# Patient Record
Sex: Female | Born: 1980 | Race: Black or African American | Hispanic: No | Marital: Married | Smoking: Never smoker
Health system: Southern US, Community
[De-identification: ages and names within clinical notes are randomized; demographics above are authoritative.]

## PROBLEM LIST (undated history)

## (undated) ENCOUNTER — Inpatient Hospital Stay (HOSPITAL_COMMUNITY): Payer: Self-pay

## (undated) DIAGNOSIS — R12 Heartburn: Secondary | ICD-10-CM

## (undated) DIAGNOSIS — G5600 Carpal tunnel syndrome, unspecified upper limb: Secondary | ICD-10-CM

## (undated) DIAGNOSIS — O409XX0 Polyhydramnios, unspecified trimester, not applicable or unspecified: Secondary | ICD-10-CM

## (undated) DIAGNOSIS — R51 Headache: Secondary | ICD-10-CM

## (undated) DIAGNOSIS — O24419 Gestational diabetes mellitus in pregnancy, unspecified control: Secondary | ICD-10-CM

## (undated) DIAGNOSIS — O26899 Other specified pregnancy related conditions, unspecified trimester: Secondary | ICD-10-CM

## (undated) DIAGNOSIS — J45909 Unspecified asthma, uncomplicated: Secondary | ICD-10-CM

## (undated) HISTORY — PX: NECK SURGERY: SHX720

---

## 2010-08-23 HISTORY — PX: MYOMECTOMY: SHX85

## 2013-05-02 ENCOUNTER — Encounter (HOSPITAL_COMMUNITY): Payer: Self-pay | Admitting: *Deleted

## 2013-05-02 ENCOUNTER — Inpatient Hospital Stay (HOSPITAL_COMMUNITY)
Admission: AD | Admit: 2013-05-02 | Discharge: 2013-05-02 | Disposition: A | Discharge status: Home / Self Care | Payer: BC Managed Care – PPO | Source: Ambulatory Visit | Attending: Obstetrics and Gynecology | Admitting: Obstetrics and Gynecology

## 2013-05-02 ENCOUNTER — Inpatient Hospital Stay (HOSPITAL_COMMUNITY): Payer: BC Managed Care – PPO

## 2013-05-02 DIAGNOSIS — R32 Unspecified urinary incontinence: Secondary | ICD-10-CM | POA: Insufficient documentation

## 2013-05-02 DIAGNOSIS — O99891 Other specified diseases and conditions complicating pregnancy: Secondary | ICD-10-CM | POA: Insufficient documentation

## 2013-05-02 DIAGNOSIS — D259 Leiomyoma of uterus, unspecified: Secondary | ICD-10-CM | POA: Insufficient documentation

## 2013-05-02 DIAGNOSIS — O3410 Maternal care for benign tumor of corpus uteri, unspecified trimester: Secondary | ICD-10-CM | POA: Insufficient documentation

## 2013-05-02 LAB — WET PREP, GENITAL
Trich, Wet Prep: NONE SEEN
Yeast Wet Prep HPF POC: NONE SEEN

## 2013-05-02 LAB — AMNISURE RUPTURE OF MEMBRANE (ROM) NOT AT ARMC: Amnisure ROM: NEGATIVE

## 2013-05-02 NOTE — Progress Notes (Signed)
Obstetric ultrasound performed today.   Study limited today to evaluation of amniotic fluid volume.  [place] IUP at 21w 1d Normal amniotic fluid volume Uterine fibroids  If not previously done, recommend ultrasound evaluation of fetal anatomy.  Given prior history of preterm delivery, serial transvaginal cervical length evaluations may also be of benefit until approximately 28 weeks.  Review prior operative reports from myomectomy and cesaran section.   Please see full report in [facility].

## 2013-05-02 NOTE — MAU Provider Note (Signed)
MAU NOTE -  Subjective: G2P0101 @[redacted]w[redacted]d  by 14 wk sono. Woke up at 0500 this am, felt wet, stood up and had fluid running down thigh. No VB or ctx, +FM. H/o myomectomy and CS for PPROM and FTP @ 34-35 wks. Weekly 17P injections.      Objective: Vital signs: VS: Blood pressure 123/59, pulse 92, temperature 97.2 F (36.2 C), temperature source Oral, resp. rate 20, height 4\' 11"  (1.499 m), weight 117.935 kg (260 lb).  Labs: Wet prep: small WBCs, neg yeast, neg clue Amnisure: negative  Physical exam:        General appearance/behavior: obese BF, anxious       Heart: RRR       Lungs CTA bilaterally       Abdomen: soft, non-tender, gravid       Extremities: trace edema to feet       External genitalia: nml       Speculum examination: No pooling, small white discharge, negative fern       Bimanual exam / VE: deferred        Fetal Assessment:  FHR 135 on sono  SONO: AFI 13.6, posterior placenta above os, cervical length 4.2cm, FHR 135   Assessment: 21.[redacted] weeks gestation Urinary incontinence  Plan:  Discharge home Follow-up 05/05/13 with Dr. Cherly Hensen, GTT and 17P injection Preterm labor precautions  Dr Ernestina Penna updated with patient status / plan of care  Donette Larry, N MSN, CNM 05/02/2013, 7:08 AM

## 2013-05-02 NOTE — MAU Note (Signed)
Pt reports gush of fluid at 0505.

## 2013-07-13 ENCOUNTER — Other Ambulatory Visit: Payer: Self-pay | Admitting: Obstetrics and Gynecology

## 2013-07-28 ENCOUNTER — Other Ambulatory Visit (HOSPITAL_COMMUNITY): Payer: Self-pay | Admitting: Obstetrics and Gynecology

## 2013-07-28 DIAGNOSIS — O24419 Gestational diabetes mellitus in pregnancy, unspecified control: Secondary | ICD-10-CM

## 2013-07-28 DIAGNOSIS — O409XX1 Polyhydramnios, unspecified trimester, fetus 1: Secondary | ICD-10-CM

## 2013-08-02 ENCOUNTER — Other Ambulatory Visit (HOSPITAL_COMMUNITY): Payer: Self-pay | Admitting: Obstetrics and Gynecology

## 2013-08-02 ENCOUNTER — Ambulatory Visit (HOSPITAL_COMMUNITY)
Admission: RE | Admit: 2013-08-02 | Discharge: 2013-08-02 | Disposition: A | Discharge status: Home / Self Care | Payer: BC Managed Care – PPO | Source: Ambulatory Visit | Attending: Obstetrics and Gynecology | Admitting: Obstetrics and Gynecology

## 2013-08-02 DIAGNOSIS — O24419 Gestational diabetes mellitus in pregnancy, unspecified control: Secondary | ICD-10-CM

## 2013-08-02 DIAGNOSIS — E669 Obesity, unspecified: Secondary | ICD-10-CM | POA: Insufficient documentation

## 2013-08-02 DIAGNOSIS — O409XX1 Polyhydramnios, unspecified trimester, fetus 1: Secondary | ICD-10-CM

## 2013-08-02 DIAGNOSIS — O99810 Abnormal glucose complicating pregnancy: Secondary | ICD-10-CM | POA: Insufficient documentation

## 2013-08-02 NOTE — Consult Note (Signed)
Maternal Fetal Medicine Consultation  Requesting Provider(s): Maxie Better, MD  Reason for consultation: Polyhydramnios, poorly controlled A2 GDM  HPI: Desiree Hopkins is a 32 yo G2P0101 currently at 66 2/7 weeks - seen today due to polyhydramnios and poorly controlled gestational diabetes.  She is currently on Glyburide 7.5 mg BID.  Reviewed fingerstick log - fasting sugars consistently in the 110s- 120's and most post prandial values also elevated in the 130-140 range despite medications. The patient's past OB history is remarkable for gestational diabetes in her previous pregnancy - she reports that she required insulin for that pregnancy.  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  The patient subsequently underwent an open myomectomy in 2011 and was told by the operating physician that she would require cesarean delivery in the future.  She is otherwise without complaints.  The fetus is active.  OB History: OB History   Grav Para Term Preterm Abortions TAB SAB Ect Mult Living   2 1  1      1       PMH:  Past Medical History  Diagnosis Date  . Medical history non-contributory     PSH:  Past Surgical History  Procedure Laterality Date  . Cesarean section    . Myomectomy     Meds: Glyburide 7.5 mg BID, Prenatal vitamins  Allergies: NKDA  FH: Denies family history of birth defects or hereditary disorders.  DM, HTN in maternal and paternal grandmothers. Paternal grandmother with breast CA  Soc: denies tobacco, ETOH use  Review of Systems: no vaginal bleeding or cramping/contractions, no LOF, no nausea/vomiting. All other systems reviewed and are negative.  PE:  Wt: 276 #, 104/65, 98  GEN: well-appearing female ABD: gravid, NT  Ultrasound: Single IUP at 34 2/7 weeks Poorly controlled gestational diabetes Limited views of the fetal anatomy obtained due to late gestational age No gross anomalies  noted Estimated fetal weight > 90th %tile (3007 g). [facility] is > 97th %tile. Polyhydramnios noted with an AFI of 36 cm - most likely due to poorly controlled gestational diabetes.   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  Prescriptions given.  Patient will follow up in MFM clinic on Thursday to meet with diabetic educator to go over insulin administration and dose.  Will continue to check fasting and 2 hr post prandial fingerstick values.  She will need weekly follow up to adjust insulin dose as appropriate to achieve target ranges. 2) Recommend 2x weekly NSTs with weekly AFIs. 3) With a prior history of an open myomectomy, recommend repeat C-section.  Ideally, would schedule the case at approximately 37 weeks due to risk of uterine rupture.  Thank you for the opportunity to be a part of the care of Desiree Hopkins. Please contact our office if we can be of further assistance.   I spent approximately 30 minutes with this patient  with over 50% of time spent in face-to-face counseling.  Alpha Gula, MD Maternal Fetal Medicine

## 2013-08-03 ENCOUNTER — Other Ambulatory Visit: Payer: Self-pay

## 2013-08-04 ENCOUNTER — Ambulatory Visit (HOSPITAL_COMMUNITY): Payer: BC Managed Care – PPO

## 2013-08-04 ENCOUNTER — Telehealth (HOSPITAL_COMMUNITY): Payer: Self-pay | Admitting: *Deleted

## 2013-08-05 ENCOUNTER — Other Ambulatory Visit (HOSPITAL_COMMUNITY): Payer: Self-pay | Admitting: Obstetrics and Gynecology

## 2013-08-05 ENCOUNTER — Ambulatory Visit (HOSPITAL_COMMUNITY)
Admission: RE | Admit: 2013-08-05 | Discharge: 2013-08-05 | Disposition: A | Discharge status: Home / Self Care | Payer: BC Managed Care – PPO | Source: Ambulatory Visit | Attending: Obstetrics and Gynecology | Admitting: Obstetrics and Gynecology

## 2013-08-05 ENCOUNTER — Encounter: Payer: BC Managed Care – PPO | Attending: Obstetrics and Gynecology | Admitting: *Deleted

## 2013-08-05 DIAGNOSIS — O409XX0 Polyhydramnios, unspecified trimester, not applicable or unspecified: Secondary | ICD-10-CM | POA: Insufficient documentation

## 2013-08-05 DIAGNOSIS — Z713 Dietary counseling and surveillance: Secondary | ICD-10-CM | POA: Insufficient documentation

## 2013-08-05 DIAGNOSIS — O289 Unspecified abnormal findings on antenatal screening of mother: Secondary | ICD-10-CM | POA: Insufficient documentation

## 2013-08-05 DIAGNOSIS — E669 Obesity, unspecified: Secondary | ICD-10-CM | POA: Insufficient documentation

## 2013-08-05 DIAGNOSIS — O99810 Abnormal glucose complicating pregnancy: Secondary | ICD-10-CM | POA: Insufficient documentation

## 2013-08-05 DIAGNOSIS — O288 Other abnormal findings on antenatal screening of mother: Secondary | ICD-10-CM

## 2013-08-05 NOTE — Consult Note (Signed)
DIABETES: Insulin start only. Patient is not following any type of meal plan. Refuses Nutritional guidance. Patient is presently performing FBS and random 2hpp 1-2 times per week. Patient was using insulin with last pregnancy and is comfortable with procedure. She agreed to CDE consult due to now taking 2 different insulin types.  Goals achieved: *Patient is able to verbalize the difference between NPH and Novolog insulin. *Patient is able to draw up appropriate dosing demonstrated viaTeach Back *Patient is able to verbalize the times of day and dosing of each insulin injection  Plan:  Initial dose 38 units NPH in AM; 14 units NPH in PM with 8 units of Humolog with each meal per Dr. Claudean Severance Will plan to see patient next week in follow up to evaluate insulin dosing.  Handouts:  Nutrition Diabetes & Pregnancy  Meal Plan Card

## 2013-08-05 NOTE — Progress Notes (Signed)
Toco/US applied for NST. 

## 2013-08-05 NOTE — Progress Notes (Signed)
Desiree Hopkins  was seen today for an ultrasound appointment.  See full report in [facility].  Impression: Single IUP at 34 5/7 weeks Poorly controlled A2 GDM, polydramnios BPP 8/10 (-2 for NR NST) AFI 31 cm.  Recommendations: Recommend twice weekly nonstress tests with weekly amniotic fluid assessment. Patient to begin split dose insulin today - see note from diabetic educator Due to history of prior open myomectomy, recommend delivery via C-section at ~ [redacted] weeks gestation.  Alpha Gula, MD

## 2013-08-05 NOTE — Addendum Note (Signed)
Encounter addended by: Rosendo Gros, RN on: 08/05/2013  5:30 PM<BR>     Documentation filed: Clinical Notes

## 2013-08-09 ENCOUNTER — Other Ambulatory Visit (HOSPITAL_COMMUNITY): Payer: BC Managed Care – PPO

## 2013-08-10 ENCOUNTER — Encounter (HOSPITAL_COMMUNITY): Payer: Self-pay | Admitting: Pharmacist

## 2013-08-12 ENCOUNTER — Other Ambulatory Visit (HOSPITAL_COMMUNITY): Payer: BC Managed Care – PPO

## 2013-08-12 ENCOUNTER — Ambulatory Visit (HOSPITAL_COMMUNITY): Payer: BC Managed Care – PPO

## 2013-08-16 ENCOUNTER — Other Ambulatory Visit (HOSPITAL_COMMUNITY): Payer: BC Managed Care – PPO

## 2013-08-23 ENCOUNTER — Encounter (HOSPITAL_COMMUNITY): Payer: Self-pay

## 2013-08-23 ENCOUNTER — Encounter (HOSPITAL_COMMUNITY)
Admission: RE | Admit: 2013-08-23 | Discharge: 2013-08-23 | Disposition: A | Discharge status: Home / Self Care | Payer: BC Managed Care – PPO | Source: Ambulatory Visit | Attending: Obstetrics and Gynecology | Admitting: Obstetrics and Gynecology

## 2013-08-23 ENCOUNTER — Encounter (INDEPENDENT_AMBULATORY_CARE_PROVIDER_SITE_OTHER): Payer: Self-pay

## 2013-08-23 DIAGNOSIS — O409XX0 Polyhydramnios, unspecified trimester, not applicable or unspecified: Secondary | ICD-10-CM

## 2013-08-23 HISTORY — DX: Other specified pregnancy related conditions, unspecified trimester: O26.899

## 2013-08-23 HISTORY — DX: Carpal tunnel syndrome, unspecified upper limb: G56.00

## 2013-08-23 HISTORY — DX: Headache: R51

## 2013-08-23 HISTORY — DX: Unspecified asthma, uncomplicated: J45.909

## 2013-08-23 HISTORY — DX: Polyhydramnios, unspecified trimester, not applicable or unspecified: O40.9XX0

## 2013-08-23 HISTORY — DX: Heartburn: R12

## 2013-08-23 LAB — BASIC METABOLIC PANEL
BUN: 6 mg/dL (ref 6–23)
CO2: 21 mEq/L (ref 19–32)
Calcium: 9.2 mg/dL (ref 8.4–10.5)
Creatinine, Ser: 0.59 mg/dL (ref 0.50–1.10)
GFR calc Af Amer: >90 mL/min (ref >90)
GFR calc non Af Amer: >90 mL/min (ref >90)

## 2013-08-23 LAB — CBC
HCT: 35.3 % — ABNORMAL LOW (ref 36.0–46.0)
Hemoglobin: 12 g/dL (ref 12.0–15.0)
MCH: 28.1 pg (ref 26.0–34.0)
MCV: 82.7 fL (ref 78.0–100.0)
RBC: 4.27 MIL/uL (ref 3.87–5.11)

## 2013-08-23 LAB — ABO/RH: ABO/RH(D): O POS

## 2013-08-23 NOTE — Patient Instructions (Addendum)
   Your procedure is scheduled on:  Tuesday, Dec 2  Enter through the [employer] [facility] at: 945 AM Pick up the phone at the desk and dial [phone] and inform us of your arrival.  Please call this number if you have any problems the morning of surgery: [phone]  Remember: Do not eat or drink after midnight: Monday Take these medicines the morning of surgery with a SIP OF WATER:  None.  Bring albuterol inhaler with you on day of surgery.   Patient instructed to take 8 units Novolog tonight as scheduled and 1/2 the dose of Novolin tonight at bedtime (instead of 16 units - take 8 units tonight).    Do not wear jewelry, make-up, or FINGER nail polish No metal in your hair or on your body. Do not wear lotions, powders, perfumes. You may wear deodorant.  Please use your CHG wash as directed prior to surgery.  Do not shave anywhere for at least 12 hours prior to first CHG shower.  Do not bring valuables to the hospital. Contacts, dentures or bridgework may not be worn into surgery.  Leave suitcase in the car. After Surgery it may be brought to your room. For patients being admitted to the hospital, checkout time is 11:00am the day of discharge.  Home with husband Desiree Hopkins.

## 2013-08-24 ENCOUNTER — Inpatient Hospital Stay (HOSPITAL_COMMUNITY)
Admission: AD | Admit: 2013-08-24 | Discharge: 2013-08-27 | DRG: 765 | Disposition: A | Discharge status: Home / Self Care | Payer: BC Managed Care – PPO | Source: Ambulatory Visit | Attending: Obstetrics and Gynecology | Admitting: Obstetrics and Gynecology

## 2013-08-24 ENCOUNTER — Encounter (HOSPITAL_COMMUNITY)
Admission: AD | Disposition: A | Discharge status: Home / Self Care | Payer: Self-pay | Source: Ambulatory Visit | Attending: Obstetrics and Gynecology

## 2013-08-24 ENCOUNTER — Encounter (HOSPITAL_COMMUNITY): Payer: BC Managed Care – PPO | Admitting: Anesthesiology

## 2013-08-24 ENCOUNTER — Encounter (HOSPITAL_COMMUNITY): Payer: Self-pay | Admitting: *Deleted

## 2013-08-24 ENCOUNTER — Inpatient Hospital Stay (HOSPITAL_COMMUNITY): Payer: BC Managed Care – PPO | Admitting: Anesthesiology

## 2013-08-24 DIAGNOSIS — E669 Obesity, unspecified: Secondary | ICD-10-CM | POA: Diagnosis present

## 2013-08-24 DIAGNOSIS — O409XX0 Polyhydramnios, unspecified trimester, not applicable or unspecified: Secondary | ICD-10-CM | POA: Diagnosis present

## 2013-08-24 DIAGNOSIS — O99892 Other specified diseases and conditions complicating childbirth: Secondary | ICD-10-CM | POA: Diagnosis present

## 2013-08-24 DIAGNOSIS — O34219 Maternal care for unspecified type scar from previous cesarean delivery: Principal | ICD-10-CM | POA: Diagnosis present

## 2013-08-24 DIAGNOSIS — O99814 Abnormal glucose complicating childbirth: Secondary | ICD-10-CM | POA: Diagnosis present

## 2013-08-24 DIAGNOSIS — O9903 Anemia complicating the puerperium: Secondary | ICD-10-CM | POA: Diagnosis not present

## 2013-08-24 DIAGNOSIS — Z794 Long term (current) use of insulin: Secondary | ICD-10-CM

## 2013-08-24 DIAGNOSIS — N736 Female pelvic peritoneal adhesions (postinfective): Secondary | ICD-10-CM | POA: Diagnosis present

## 2013-08-24 DIAGNOSIS — D62 Acute posthemorrhagic anemia: Secondary | ICD-10-CM | POA: Diagnosis not present

## 2013-08-24 LAB — GLUCOSE, CAPILLARY
Glucose-Capillary: 87 mg/dL (ref 70–99)
Glucose-Capillary: 109 mg/dL — ABNORMAL HIGH (ref 70–99)

## 2013-08-24 LAB — RPR: RPR Ser Ql: NONREACTIVE

## 2013-08-24 SURGERY — Surgical Case
Anesthesia: Epidural | Site: Abdomen

## 2013-08-24 MED ORDER — PHENYLEPHRINE 40 MCG/ML (10ML) SYRINGE FOR IV PUSH (FOR BLOOD PRESSURE SUPPORT)
PREFILLED_SYRINGE | INTRAVENOUS | Status: AC
  Filled 2013-08-24: qty 5

## 2013-08-24 MED ORDER — ONDANSETRON HCL 4 MG/2ML IJ SOLN
INTRAMUSCULAR | Status: DC | PRN
  Administered 2013-08-24: 4 mg via INTRAVENOUS

## 2013-08-24 MED ORDER — INSULIN ASPART 100 UNIT/ML ~~LOC~~ SOLN: 8.0000 [IU] | Freq: Three times a day (TID) | SUBCUTANEOUS | Status: DC

## 2013-08-24 MED ORDER — LACTATED RINGERS IV SOLN
Freq: Once | INTRAVENOUS | Status: AC
  Administered 2013-08-24: 10:00:00 via INTRAVENOUS

## 2013-08-24 MED ORDER — DIPHENHYDRAMINE HCL 25 MG PO CAPS: 25.0000 mg | ORAL_CAPSULE | Freq: Four times a day (QID) | ORAL | Status: DC | PRN

## 2013-08-24 MED ORDER — FENTANYL CITRATE 0.05 MG/ML IJ SOLN
INTRAMUSCULAR | Status: AC
  Filled 2013-08-24: qty 2

## 2013-08-24 MED ORDER — DIPHENHYDRAMINE HCL 50 MG/ML IJ SOLN: 12.5000 mg | INTRAMUSCULAR | Status: DC | PRN

## 2013-08-24 MED ORDER — MORPHINE SULFATE (PF) 0.5 MG/ML IJ SOLN
INTRAMUSCULAR | Status: DC | PRN
  Administered 2013-08-24: 3 mg via EPIDURAL

## 2013-08-24 MED ORDER — CEFAZOLIN SODIUM 10 G IJ SOLR
3.0000 g | INTRAMUSCULAR | Status: DC
  Filled 2013-08-24: qty 3000

## 2013-08-24 MED ORDER — LIDOCAINE-EPINEPHRINE (PF) 2 %-1:200000 IJ SOLN
INTRAMUSCULAR | Status: AC
  Filled 2013-08-24: qty 20

## 2013-08-24 MED ORDER — BUPIVACAINE HCL (PF) 0.25 % IJ SOLN
INTRAMUSCULAR | Status: DC | PRN
  Administered 2013-08-24: 10 mL

## 2013-08-24 MED ORDER — SIMETHICONE 80 MG PO CHEW
80.0000 mg | CHEWABLE_TABLET | ORAL | Status: DC
  Filled 2013-08-24 (×2): qty 1

## 2013-08-24 MED ORDER — METHYLERGONOVINE MALEATE 0.2 MG PO TABS: 0.2000 mg | ORAL_TABLET | ORAL | Status: DC | PRN

## 2013-08-24 MED ORDER — DIPHENHYDRAMINE HCL 50 MG/ML IJ SOLN: 25.0000 mg | INTRAMUSCULAR | Status: DC | PRN

## 2013-08-24 MED ORDER — SCOPOLAMINE 1 MG/3DAYS TD PT72
1.0000 | MEDICATED_PATCH | Freq: Once | TRANSDERMAL | Status: DC
  Administered 2013-08-24: 1.5 mg via TRANSDERMAL

## 2013-08-24 MED ORDER — FERROUS SULFATE 325 (65 FE) MG PO TABS
325.0000 mg | ORAL_TABLET | Freq: Two times a day (BID) | ORAL | Status: DC
  Administered 2013-08-25 – 2013-08-26 (×4): 325 mg via ORAL
  Filled 2013-08-24 (×4): qty 1

## 2013-08-24 MED ORDER — PROMETHAZINE HCL 25 MG/ML IJ SOLN: 6.2500 mg | INTRAMUSCULAR | Status: DC | PRN

## 2013-08-24 MED ORDER — FENTANYL CITRATE 0.05 MG/ML IJ SOLN
INTRAMUSCULAR | Status: AC
  Filled 2013-08-24: qty 5

## 2013-08-24 MED ORDER — MEPERIDINE HCL 25 MG/ML IJ SOLN: 6.2500 mg | INTRAMUSCULAR | Status: DC | PRN

## 2013-08-24 MED ORDER — OXYTOCIN 10 UNIT/ML IJ SOLN
40.0000 [IU] | INTRAVENOUS | Status: DC | PRN
  Administered 2013-08-24: 40 [IU] via INTRAVENOUS

## 2013-08-24 MED ORDER — NALBUPHINE HCL 10 MG/ML IJ SOLN
5.0000 mg | INTRAMUSCULAR | Status: DC | PRN
  Filled 2013-08-24: qty 1

## 2013-08-24 MED ORDER — SIMETHICONE 80 MG PO CHEW: 80.0000 mg | CHEWABLE_TABLET | ORAL | Status: DC | PRN

## 2013-08-24 MED ORDER — LANOLIN HYDROUS EX OINT: 1.0000 "application " | TOPICAL_OINTMENT | CUTANEOUS | Status: DC | PRN

## 2013-08-24 MED ORDER — SENNOSIDES-DOCUSATE SODIUM 8.6-50 MG PO TABS
2.0000 | ORAL_TABLET | ORAL | Status: DC
  Administered 2013-08-26: 2 via ORAL
  Filled 2013-08-24 (×2): qty 2

## 2013-08-24 MED ORDER — PHENYLEPHRINE HCL 10 MG/ML IJ SOLN
INTRAMUSCULAR | Status: DC | PRN
  Administered 2013-08-24: 80 ug via INTRAVENOUS

## 2013-08-24 MED ORDER — MORPHINE SULFATE (PF) 0.5 MG/ML IJ SOLN
INTRAMUSCULAR | Status: DC | PRN
  Administered 2013-08-24: 2 mg via INTRAVENOUS

## 2013-08-24 MED ORDER — ONDANSETRON HCL 4 MG/2ML IJ SOLN: 4.0000 mg | Freq: Three times a day (TID) | INTRAMUSCULAR | Status: DC | PRN

## 2013-08-24 MED ORDER — SODIUM BICARBONATE 8.4 % IV SOLN
INTRAVENOUS | Status: AC
  Filled 2013-08-24: qty 50

## 2013-08-24 MED ORDER — FENTANYL CITRATE 0.05 MG/ML IJ SOLN
INTRAMUSCULAR | Status: DC | PRN
  Administered 2013-08-24 (×3): 50 ug via INTRAVENOUS

## 2013-08-24 MED ORDER — SCOPOLAMINE 1 MG/3DAYS TD PT72
MEDICATED_PATCH | TRANSDERMAL | Status: AC
  Administered 2013-08-24: 1.5 mg via TRANSDERMAL
  Filled 2013-08-24: qty 1

## 2013-08-24 MED ORDER — FENTANYL CITRATE 0.05 MG/ML IJ SOLN
INTRAMUSCULAR | Status: DC | PRN
  Administered 2013-08-24: 100 ug via EPIDURAL

## 2013-08-24 MED ORDER — NALOXONE HCL 1 MG/ML IJ SOLN: 1.0000 ug/kg/h | INTRAMUSCULAR | Status: DC | PRN

## 2013-08-24 MED ORDER — METHYLERGONOVINE MALEATE 0.2 MG/ML IJ SOLN: 0.2000 mg | INTRAMUSCULAR | Status: DC | PRN

## 2013-08-24 MED ORDER — KETOROLAC TROMETHAMINE 30 MG/ML IJ SOLN
30.0000 mg | Freq: Four times a day (QID) | INTRAMUSCULAR | Status: AC | PRN
  Administered 2013-08-24 – 2013-08-25 (×3): 30 mg via INTRAVENOUS
  Filled 2013-08-24: qty 1

## 2013-08-24 MED ORDER — ZOLPIDEM TARTRATE 5 MG PO TABS: 5.0000 mg | ORAL_TABLET | Freq: Every evening | ORAL | Status: DC | PRN

## 2013-08-24 MED ORDER — SODIUM CHLORIDE 0.9 % IJ SOLN: 3.0000 mL | INTRAMUSCULAR | Status: DC | PRN

## 2013-08-24 MED ORDER — ONDANSETRON HCL 4 MG/2ML IJ SOLN: 4.0000 mg | INTRAMUSCULAR | Status: DC | PRN

## 2013-08-24 MED ORDER — FENTANYL CITRATE 0.05 MG/ML IJ SOLN: 25.0000 ug | INTRAMUSCULAR | Status: DC | PRN

## 2013-08-24 MED ORDER — DEXTROSE 5 % IV SOLN
3.0000 g | INTRAVENOUS | Status: AC
  Administered 2013-08-24: 3 g via INTRAVENOUS
  Filled 2013-08-24: qty 3000

## 2013-08-24 MED ORDER — NALOXONE HCL 0.4 MG/ML IJ SOLN: 0.4000 mg | INTRAMUSCULAR | Status: DC | PRN

## 2013-08-24 MED ORDER — ONDANSETRON HCL 4 MG PO TABS: 4.0000 mg | ORAL_TABLET | ORAL | Status: DC | PRN

## 2013-08-24 MED ORDER — OXYTOCIN 40 UNITS IN LACTATED RINGERS INFUSION - SIMPLE MED: 62.5000 mL/h | INTRAVENOUS | Status: AC

## 2013-08-24 MED ORDER — INSULIN NPH (HUMAN) (ISOPHANE) 100 UNIT/ML ~~LOC~~ SUSP
18.0000 [IU] | Freq: Every day | SUBCUTANEOUS | Status: DC
  Filled 2013-08-24: qty 10

## 2013-08-24 MED ORDER — MORPHINE SULFATE 0.5 MG/ML IJ SOLN
INTRAMUSCULAR | Status: AC
  Filled 2013-08-24: qty 10

## 2013-08-24 MED ORDER — OXYTOCIN 10 UNIT/ML IJ SOLN
INTRAMUSCULAR | Status: AC
  Filled 2013-08-24: qty 4

## 2013-08-24 MED ORDER — PRENATAL MULTIVITAMIN CH
1.0000 | ORAL_TABLET | Freq: Every day | ORAL | Status: DC
  Filled 2013-08-24 (×2): qty 1

## 2013-08-24 MED ORDER — KETOROLAC TROMETHAMINE 30 MG/ML IJ SOLN
INTRAMUSCULAR | Status: AC
  Administered 2013-08-24: 30 mg via INTRAVENOUS
  Filled 2013-08-24: qty 1

## 2013-08-24 MED ORDER — DIBUCAINE 1 % RE OINT: 1.0000 "application " | TOPICAL_OINTMENT | RECTAL | Status: DC | PRN

## 2013-08-24 MED ORDER — ONDANSETRON HCL 4 MG/2ML IJ SOLN
INTRAMUSCULAR | Status: AC
  Filled 2013-08-24: qty 2

## 2013-08-24 MED ORDER — ALBUTEROL SULFATE HFA 108 (90 BASE) MCG/ACT IN AERS: 2.0000 | INHALATION_SPRAY | Freq: Four times a day (QID) | RESPIRATORY_TRACT | Status: DC | PRN

## 2013-08-24 MED ORDER — SODIUM CHLORIDE 0.9 % IJ SOLN
3.0000 mL | Freq: Two times a day (BID) | INTRAMUSCULAR | Status: DC
  Administered 2013-08-25: 3 mL via INTRAVENOUS

## 2013-08-24 MED ORDER — OXYCODONE-ACETAMINOPHEN 5-325 MG PO TABS
1.0000 | ORAL_TABLET | ORAL | Status: DC | PRN
  Administered 2013-08-25: 1 via ORAL
  Administered 2013-08-25 (×2): 2 via ORAL
  Administered 2013-08-25: 1 via ORAL
  Administered 2013-08-26 – 2013-08-27 (×8): 2 via ORAL
  Filled 2013-08-24: qty 2
  Filled 2013-08-24: qty 1
  Filled 2013-08-24 (×3): qty 2
  Filled 2013-08-24: qty 1
  Filled 2013-08-24 (×4): qty 2
  Filled 2013-08-24: qty 1
  Filled 2013-08-24 (×2): qty 2

## 2013-08-24 MED ORDER — FLEET ENEMA 7-19 GM/118ML RE ENEM: 1.0000 | ENEMA | Freq: Every day | RECTAL | Status: DC | PRN

## 2013-08-24 MED ORDER — SCOPOLAMINE 1 MG/3DAYS TD PT72: 1.0000 | MEDICATED_PATCH | Freq: Once | TRANSDERMAL | Status: DC

## 2013-08-24 MED ORDER — IBUPROFEN 600 MG PO TABS
600.0000 mg | ORAL_TABLET | Freq: Four times a day (QID) | ORAL | Status: DC
  Administered 2013-08-25 – 2013-08-27 (×9): 600 mg via ORAL
  Filled 2013-08-24 (×9): qty 1

## 2013-08-24 MED ORDER — HYDROMORPHONE HCL PF 1 MG/ML IJ SOLN
INTRAMUSCULAR | Status: AC
  Filled 2013-08-24: qty 1

## 2013-08-24 MED ORDER — BISACODYL 10 MG RE SUPP: 10.0000 mg | Freq: Every day | RECTAL | Status: DC | PRN

## 2013-08-24 MED ORDER — MENTHOL 3 MG MT LOZG: 1.0000 | LOZENGE | OROMUCOSAL | Status: DC | PRN

## 2013-08-24 MED ORDER — MIDAZOLAM HCL 2 MG/2ML IJ SOLN: 0.5000 mg | Freq: Once | INTRAMUSCULAR | Status: DC | PRN

## 2013-08-24 MED ORDER — BUPIVACAINE HCL (PF) 0.25 % IJ SOLN
INTRAMUSCULAR | Status: AC
  Filled 2013-08-24: qty 30

## 2013-08-24 MED ORDER — INSULIN NPH (HUMAN) (ISOPHANE) 100 UNIT/ML ~~LOC~~ SUSP
18.0000 [IU] | Freq: Every day | SUBCUTANEOUS | Status: DC
  Administered 2013-08-25: 18 [IU] via SUBCUTANEOUS

## 2013-08-24 MED ORDER — WITCH HAZEL-GLYCERIN EX PADS: 1.0000 "application " | MEDICATED_PAD | CUTANEOUS | Status: DC | PRN

## 2013-08-24 MED ORDER — ACETAMINOPHEN 500 MG PO TABS: 1000.0000 mg | ORAL_TABLET | Freq: Four times a day (QID) | ORAL | Status: AC

## 2013-08-24 MED ORDER — SODIUM BICARBONATE 8.4 % IV SOLN
INTRAVENOUS | Status: DC | PRN
  Administered 2013-08-24: 5 mL via EPIDURAL
  Administered 2013-08-24: 2 mL via EPIDURAL
  Administered 2013-08-24: 5 mL via EPIDURAL
  Administered 2013-08-24: 3 mL via EPIDURAL
  Administered 2013-08-24 (×2): 5 mL via EPIDURAL

## 2013-08-24 MED ORDER — PHENYLEPHRINE 8 MG IN D5W 100 ML (0.08MG/ML) PREMIX OPTIME
INJECTION | INTRAVENOUS | Status: DC | PRN
  Administered 2013-08-24: 15 ug/min via INTRAVENOUS

## 2013-08-24 MED ORDER — SODIUM CHLORIDE 0.9 % IJ SOLN
3.0000 mL | INTRAMUSCULAR | Status: DC | PRN
  Administered 2013-08-25: 3 mL via INTRAVENOUS

## 2013-08-24 MED ORDER — DIPHENHYDRAMINE HCL 25 MG PO CAPS: 25.0000 mg | ORAL_CAPSULE | ORAL | Status: DC | PRN

## 2013-08-24 MED ORDER — PHENYLEPHRINE 8 MG IN D5W 100 ML (0.08MG/ML) PREMIX OPTIME
INJECTION | INTRAVENOUS | Status: AC
  Filled 2013-08-24: qty 100

## 2013-08-24 MED ORDER — HYDROMORPHONE HCL PF 1 MG/ML IJ SOLN
INTRAMUSCULAR | Status: DC | PRN
  Administered 2013-08-24: 1 mg via INTRAVENOUS

## 2013-08-24 MED ORDER — METOCLOPRAMIDE HCL 5 MG/ML IJ SOLN: 10.0000 mg | Freq: Three times a day (TID) | INTRAMUSCULAR | Status: DC | PRN

## 2013-08-24 MED ORDER — LACTATED RINGERS IV SOLN
INTRAVENOUS | Status: DC
  Administered 2013-08-24 (×4): via INTRAVENOUS

## 2013-08-24 MED ORDER — KETOROLAC TROMETHAMINE 30 MG/ML IJ SOLN
30.0000 mg | Freq: Four times a day (QID) | INTRAMUSCULAR | Status: AC | PRN
  Filled 2013-08-24: qty 1

## 2013-08-24 MED ORDER — SODIUM CHLORIDE 0.9 % IV SOLN: 250.0000 mL | INTRAVENOUS | Status: DC

## 2013-08-24 MED ORDER — IBUPROFEN 600 MG PO TABS: 600.0000 mg | ORAL_TABLET | Freq: Four times a day (QID) | ORAL | Status: DC | PRN

## 2013-08-24 MED ORDER — SIMETHICONE 80 MG PO CHEW
80.0000 mg | CHEWABLE_TABLET | Freq: Three times a day (TID) | ORAL | Status: DC
  Filled 2013-08-24 (×3): qty 1

## 2013-08-24 MED ORDER — INSULIN ASPART 100 UNIT/ML ~~LOC~~ SOLN
4.0000 [IU] | Freq: Three times a day (TID) | SUBCUTANEOUS | Status: DC
  Administered 2013-08-25: 4 [IU] via SUBCUTANEOUS

## 2013-08-24 SURGICAL SUPPLY — 40 items
BARRIER ADHS 3X4 INTERCEED (GAUZE/BANDAGES/DRESSINGS) ×2 IMPLANT
BENZOIN TINCTURE PRP APPL 2/3 (GAUZE/BANDAGES/DRESSINGS) ×2 IMPLANT
CLAMP CORD UMBIL (MISCELLANEOUS) IMPLANT
CLOTH BEACON ORANGE TIMEOUT ST (SAFETY) ×2 IMPLANT
CONTAINER PREFILL 10% NBF 15ML (MISCELLANEOUS) IMPLANT
DRAPE LG THREE QUARTER DISP (DRAPES) IMPLANT
DRSG OPSITE POSTOP 4X10 (GAUZE/BANDAGES/DRESSINGS) ×2 IMPLANT
DURAPREP 26ML APPLICATOR (WOUND CARE) ×2 IMPLANT
ELECT REM PT RETURN 9FT ADLT (ELECTROSURGICAL) ×2
ELECTRODE REM PT RTRN 9FT ADLT (ELECTROSURGICAL) ×1 IMPLANT
EXTRACTOR VACUUM M CUP 4 TUBE (SUCTIONS) IMPLANT
GLOVE BIOGEL PI IND STRL 7.0 (GLOVE) ×1 IMPLANT
GLOVE BIOGEL PI INDICATOR 7.0 (GLOVE) ×1
GLOVE ECLIPSE 6.5 STRL STRAW (GLOVE) ×2 IMPLANT
GOWN PREVENTION PLUS XLARGE (GOWN DISPOSABLE) ×4 IMPLANT
GOWN STRL REIN XL XLG (GOWN DISPOSABLE) ×4 IMPLANT
KIT ABG SYR 3ML LUER SLIP (SYRINGE) IMPLANT
NEEDLE HYPO 25X1 1.5 SAFETY (NEEDLE) ×2 IMPLANT
NEEDLE HYPO 25X5/8 SAFETYGLIDE (NEEDLE) IMPLANT
NS IRRIG 1000ML POUR BTL (IV SOLUTION) ×2 IMPLANT
PACK C SECTION WH (CUSTOM PROCEDURE TRAY) ×2 IMPLANT
PAD OB MATERNITY 4.3X12.25 (PERSONAL CARE ITEMS) ×2 IMPLANT
RTRCTR C-SECT PINK 25CM LRG (MISCELLANEOUS) IMPLANT
STAPLER VISISTAT 35W (STAPLE) IMPLANT
STRIP CLOSURE SKIN 1/2X4 (GAUZE/BANDAGES/DRESSINGS) ×2 IMPLANT
SUT CHROMIC GUT AB #0 18 (SUTURE) IMPLANT
SUT MNCRL 0 VIOLET CTX 36 (SUTURE) ×3 IMPLANT
SUT MON AB 4-0 PS1 27 (SUTURE) ×2 IMPLANT
SUT MONOCRYL 0 CTX 36 (SUTURE) ×3
SUT PLAIN 2 0 (SUTURE)
SUT PLAIN 2 0 XLH (SUTURE) ×2 IMPLANT
SUT PLAIN ABS 2-0 CT1 27XMFL (SUTURE) IMPLANT
SUT VIC AB 0 CT1 36 (SUTURE) ×4 IMPLANT
SUT VIC AB 2-0 CT1 27 (SUTURE) ×1
SUT VIC AB 2-0 CT1 TAPERPNT 27 (SUTURE) ×1 IMPLANT
SUT VIC AB 3-0 FS2 27 (SUTURE) ×2 IMPLANT
SYR CONTROL 10ML LL (SYRINGE) ×2 IMPLANT
TOWEL OR 17X24 6PK STRL BLUE (TOWEL DISPOSABLE) ×2 IMPLANT
TRAY FOLEY CATH 14FR (SET/KITS/TRAYS/PACK) ×2 IMPLANT
WATER STERILE IRR 1000ML POUR (IV SOLUTION) IMPLANT

## 2013-08-24 NOTE — Transfer of Care (Signed)
Immediate Anesthesia Transfer of Care Note  Patient: Desiree Hopkins  Procedure(s) Performed: Procedure(s) with comments: Repeat CESAREAN SECTION (N/A) - EDD: 09/11/13  Patient Location: PACU  Anesthesia Type:Epidural  Level of Consciousness: awake, alert  and oriented  Airway & Oxygen Therapy: Patient Spontanous Breathing  Post-op Assessment: Report given to PACU RN and Post -op Vital signs reviewed and stable  Post vital signs: stable  Complications: No apparent anesthesia complications

## 2013-08-24 NOTE — Consult Note (Signed)
Neonatology Note:   Attendance at C-section:    I was asked by Dr. Cherly Hensen to attend this repeat C/S at 37 2/7 weeks due to prior myomectomy. The mother is a G2P1 O pos, Hep B and HIV status not found, GBS not found with poorly-controlled GDM and polyhydramnios. ROM at delivery, fluid clear. Infant vigorous with good spontaneous cry and tone. Needed only minimal bulb suctioning. Ap 9/9. Lungs clear to ausc in DR. To CN to care of Pediatrician.   Doretha Sou, MD

## 2013-08-24 NOTE — Progress Notes (Signed)
IV added to documentation at 2045 assessment. Not previously documented on.

## 2013-08-24 NOTE — Brief Op Note (Signed)
08/24/2013  12:53 PM  PATIENT:  Desiree Hopkins  32 y.o. female  PRE-OPERATIVE DIAGNOSIS:  Previous Cesarean Section, Previous Myomectomy, Term, Morbid Obesity, Class A2 gestational diabetes, Polyhydramnios, IUP @ 37 2/7 weeks  POST-OPERATIVE DIAGNOSIS:  Previous Cesarean Section, Previous Myomectomy, Term, Morbid obesity, Class A2  GDM, Polyhydramnios, IUP @ 37 2/7 weeks, abdominopelvic adhesions  PROCEDURE:  Repeat Cesarean section, Sharl Ma hysterotomy, extensive lysis of adhesions  SURGEON:  Surgeon(s) and Role:    * Margaruite Top Cathie Beams, MD - Primary  PHYSICIAN ASSISTANT:   ASSISTANTS: Raelyn Mora, CNM Marlinda Mike, CNM   ANESTHESIA:   epidural and spinal FINDINGS:  Live female floating vtx, copious clear amniotic fluid, polycystic ovaries, nl tubes, extensive adhesion to uterus, right sidewall and anterior abdominal wall, all lysed EBL:  Total I/O In: 3000 [I.V.:3000] Out: 700 [Urine:100; Blood:600]  BLOOD ADMINISTERED:none  DRAINS: none   LOCAL MEDICATIONS USED:  MARCAINE     SPECIMEN:  Source of Specimen:  Placenta not sent  DISPOSITION OF SPECIMEN:  N/A  COUNTS:  YES  TOURNIQUET:  * No tourniquets in log *  DICTATION: .Other Dictation: Dictation Number [id number]  PLAN OF CARE: Admit to inpatient   PATIENT DISPOSITION:  PACU - hemodynamically stable.   Delay start of Pharmacological VTE agent (>24hrs) due to surgical blood loss or risk of bleeding: no

## 2013-08-24 NOTE — Anesthesia Postprocedure Evaluation (Signed)
  Anesthesia Post Note  Patient: Desiree Hopkins  Procedure(s) Performed: Procedure(s) (LRB): Repeat CESAREAN SECTION (N/A)  Anesthesia type: Spinal  Patient location: PACU  Post pain: Pain level controlled  Post assessment: Post-op Vital signs reviewed  Last Vitals:  Filed Vitals:   08/24/13 0936  BP: 123/74  Pulse: 92  Temp: 36.5 C  Resp: 18    Post vital signs: Reviewed  Level of consciousness: awake  Complications: No apparent anesthesia complications

## 2013-08-24 NOTE — Anesthesia Procedure Notes (Signed)
Epidural Patient location during procedure: OB Start time: 08/24/2013 11:10 AM  Staffing Anesthesiologist: Brayton Caves Performed by: anesthesiologist   Preanesthetic Checklist Completed: patient identified, site marked, surgical consent, pre-op evaluation, timeout performed, IV checked, risks and benefits discussed and monitors and equipment checked  Epidural Patient position: sitting Prep: site prepped and draped and DuraPrep Patient monitoring: continuous pulse ox and blood pressure Approach: midline Injection technique: LOR air  Needle:  Needle type: Tuohy  Needle gauge: 17 G Needle length: 9 cm and 9 Needle insertion depth: 8 cm Catheter type: closed end flexible Catheter size: 19 Gauge Catheter at skin depth: 11 cm Test dose: negative  Assessment Events: blood not aspirated, injection not painful, no injection resistance, negative IV test and no paresthesia  Additional Notes Patient identified.  Risk benefits discussed including failed block, incomplete pain control, headache, nerve damage, paralysis, blood pressure changes, nausea, vomiting, reactions to medication both toxic or allergic, and postpartum back pain.  Patient expressed understanding and wished to proceed.  All questions were answered.  Sterile technique used throughout procedure and epidural site dressed with sterile barrier dressing. No paresthesia or other complications noted.The patient did not experience any signs of intravascular injection such as tinnitus or metallic taste in mouth nor signs of intrathecal spread such as rapid motor block. Please see nursing notes for vital signs.

## 2013-08-24 NOTE — Anesthesia Preprocedure Evaluation (Addendum)
Anesthesia Evaluation  Patient identified by MRN, date of birth, ID band Patient awake    Reviewed: Allergy & Precautions, H&P , NPO status , Patient's Chart, lab work & pertinent test results  Airway Mallampati: IV      Dental   Pulmonary asthma ,  breath sounds clear to auscultation        Cardiovascular Exercise Tolerance: Good Rhythm:regular Rate:Normal     Neuro/Psych  Headaches,  Neuromuscular disease    GI/Hepatic   Endo/Other  diabetesMorbid obesity  Renal/GU      Musculoskeletal   Abdominal   Peds  Hematology   Anesthesia Other Findings   Reproductive/Obstetrics (+) Pregnancy                           Anesthesia Physical Anesthesia Plan  ASA: III  Anesthesia Plan: Epidural   Post-op Pain Management:    Induction:   Airway Management Planned:   Additional Equipment:   Intra-op Plan:   Post-operative Plan:   Informed Consent: I have reviewed the patients History and Physical, chart, labs and discussed the procedure including the risks, benefits and alternatives for the proposed anesthesia with the patient or authorized representative who has indicated his/her understanding and acceptance.     Plan Discussed with: Anesthesiologist, CRNA and Surgeon  Anesthesia Plan Comments:        Anesthesia Quick Evaluation

## 2013-08-25 ENCOUNTER — Encounter (HOSPITAL_COMMUNITY): Payer: Self-pay | Admitting: Obstetrics and Gynecology

## 2013-08-25 LAB — CBC
Hemoglobin: 9.5 g/dL — ABNORMAL LOW (ref 12.0–15.0)
MCHC: 33.9 g/dL (ref 30.0–36.0)
MCV: 82.6 fL (ref 78.0–100.0)
Platelets: 168 10*3/uL (ref 150–400)
RDW: 15.9 % — ABNORMAL HIGH (ref 11.5–15.5)
WBC: 11.1 10*3/uL — ABNORMAL HIGH (ref 4.0–10.5)

## 2013-08-25 LAB — CCBB MATERNAL DONOR DRAW

## 2013-08-25 LAB — GLUCOSE, CAPILLARY
Glucose-Capillary: 108 mg/dL — ABNORMAL HIGH (ref 70–99)
Glucose-Capillary: 86 mg/dL (ref 70–99)
Glucose-Capillary: 85 mg/dL (ref 70–99)

## 2013-08-25 LAB — BIRTH TISSUE RECOVERY COLLECTION (PLACENTA DONATION)

## 2013-08-25 MED ORDER — OXYCODONE HCL 5 MG PO TABS
5.0000 mg | ORAL_TABLET | ORAL | Status: DC | PRN
  Administered 2013-08-25 (×3): 5 mg via ORAL
  Filled 2013-08-25 (×3): qty 1

## 2013-08-25 MED ORDER — LACTATED RINGERS IV SOLN
INTRAVENOUS | Status: DC
  Administered 2013-08-25: 01:00:00 via INTRAVENOUS

## 2013-08-25 NOTE — Anesthesia Postprocedure Evaluation (Signed)
Anesthesia Post Note  Patient: Desiree Hopkins  Procedure(s) Performed: Procedure(s) (LRB): Repeat CESAREAN SECTION (N/A)  Anesthesia type: Spinal  Patient location: Mother/Baby  Post pain: Pain level controlled  Post assessment: Post-op Vital signs reviewed  Last Vitals:  Filed Vitals:   08/25/13 0500  BP: 92/50  Pulse: 89  Temp: 36.9 C  Resp: 18    Post vital signs: Reviewed  Level of consciousness: awake  Complications: No apparent anesthesia complications

## 2013-08-25 NOTE — Progress Notes (Signed)
POD # 1  Subjective: Pt reports feeling tired/ Pain controlled with Motrin and long-acting narcotic Tolerating po/ Foley d/c'd and due to void / No n/v/ Flatus absent Activity: up with assistance Bleeding is light Newborn info:  Information for the patient's newborn:  Lekisha, Mcghee [[id number]]  female Feeding: breast   Objective:  VS:  Filed Vitals:   08/24/13 2035 08/24/13 2040 08/25/13 0030 08/25/13 0500  BP: 95/59 95/62 91/54  92/50  Pulse: 109 105 99 89  Temp:   98 F (36.7 C) 98.4 F (36.9 C)  TempSrc:   Oral Oral  Resp:   18 18  Weight:      SpO2:   98% 97%     I&O: Intake/Output     12/02 0701 - 12/03 0700 12/03 0701 - 12/04 0700   I.V. (mL/kg) 5322.9 (41.9)    Total Intake(mL/kg) 5322.9 (41.9)    Urine (mL/kg/hr) 1350    Blood 650    Total Output 2000     Net +3322.9             Recent Labs  08/23/13 1556 08/25/13 0553  WBC 8.4 11.1*  HGB 12.0 9.5*  HCT 35.3* 28.0*  PLT 189 168    Blood type: --/--/O POS, O POS (12/01 1556) Rubella:      Physical Exam:  General: alert, cooperative and morbidly obese CV: Regular rate and rhythm Resp: CTA bilaterally Abdomen: soft, nontender, normal bowel sounds Incision: healing well, no erythema, no hernia, no seroma, no swelling, well approximated, scant pink drainage on left inferior border of honeycomb dressing Uterine Fundus: firm, below umbilicus, nontender Lochia: minimal Ext: edema 1+ to bilateral lower extremeties and Homans sign is negative, no sign of DVT    Assessment: POD # 1/ G2P1102/ S/P C/Section d/t repeat, A2GDM A2GDM, delivered; insulin dependent ABL anemia Fluid inbalance Doing well  Plan: Ambulate Continue use is IS Insulin dose adjusted by half, monitor BS today Monitor urine output Continue routine post op orders   Signed: Donette Larry, N, MSN, CNM 08/25/2013, 9:14 AM

## 2013-08-25 NOTE — Op Note (Signed)
NAME:  Desiree Hopkins, Desiree Hopkins                ACCOUNT NO.:  [id number]  MEDICAL RECORD NO.:  [id number]  LOCATION:  9126                          FACILITY:  [facility]  PHYSICIAN:  Maxie Better, M.D.DATE OF BIRTH:  09/22/1981  DATE OF PROCEDURE:  08/24/2013 DATE OF DISCHARGE:                              OPERATIVE REPORT   PREOPERATIVE DIAGNOSES:  Previous myomectomy, intrauterine gestation at 37 weeks, previous cesarean section, class A2 gestational diabetes, polyhydramnios.  PROCEDURE:  Repeat cesarean section, Kerr hysterotomy, extensive lysis of adhesions.  POSTOPERATIVE DIAGNOSES: Extensive abdominopelvic adhesions, previous myomectomy, previous cesarean section, intrauterine gestation at 37 weeks, polyhydramnios, Class A2 gestational diabetes.  ANESTHESIA:  Combined spinal/epidural.  SURGEON:  Maxie Better, MD  ASSISTANTS:  Raelyn Mora, CNM and Marlinda Mike, CNM.  PROCEDURE:  Under adequate combined spinal epidural anesthesia, the patient was placed in a supine position with a left lateral tilt.  She was sterilely prepped and draped in usual fashion.  Indwelling Foley catheter was sterilely placed.  Due to her large pannus, the abdomen was pulled upward.  A 0.25% Marcaine was injected along the previous Pfannenstiel skin incision.  Pfannenstiel skin incision then made and carried down to the rectus fascia.  The rectus fascia was opened transversely.  The rectus fascia then bluntly and sharply dissected off the rectus muscle in superior and inferior fashion.  On doing so, there was noted an incidental entry into the peritoneal cavity with omentum adherent to the upper part of the incision and on the anterior abdominal wall.  Finding a window in the parietal peritoneum, incision was then carefully extended and the abdominal cavity entered.  Adhesions along the anterior aspect of the uterus was noted extending from the lower uterine segment up to the fundus which had to be lysed  and freed up in order to find the vesicouterine peritoneal reflection.  On the right side, there was obliteration of the pelvis due to the adhesions as well.  Attention was then predominantly at that point turned towards the delivery of the baby with the vesicouterine peritoneum opened transversely and the bladder carefully dissected off the lower uterine segment.  Adhesions in that area had been lysed.  A curvilinear low transverse incision was then made and extended with bandage scissors.  Artificial rupture of membranes was done.  Clear copious amniotic fluid was noted with a floating vertex in the right lower quadrant.  Once the vertex was stabilized, subsequent delivery of a live female was accomplished, bulb suctioned on the abdomen.  The Apgars were assigned Apgars of 8 and 9 at 1 and 5 minutes. The placenta which was large and posterior was manually removed intact. Uterine cavity was cleaned of debris.  Uterine incision had no extension, was closed in 2 layers, the first layer with 0 Monocryl running locked stitch, second layer was imbricated using 0 Monocryl suture.  Figure-of-eight suture x2 was placed on the right aspect of the incision for bleeding with good hemostasis.  At that point, attention was then turned to the right abdominal wall adhesions that was noted.  The patient particularly had pain noted particularly on the right side.  The omental adhesions were wrapped over and attached to  the anterior wall onto the uterus, these were carefully lysed off the anterior abdominal wall and in particular the right obliterated area due to the adhesions was lysed and removed off the anterior wall on the lateral side of the uterus.  Once this was done, the right tube and ovary was noted to be normal.  The left ovary was slightly polycystic appearing but otherwise normal, and was high on the posterior fundal area.  Normal tube was noted.  The abdomen was then copiously irrigated and  suctioned.  The omentum was inspected, any defects was cut and taking care of any bleeding on that omentum was also cauterized.  Interceed was placed over the lower uterine segment to reduce scar tissue formation, and the parietal peritoneum was then closed with 2-0 Vicryl, the rectus fascia was then closed with 0 Vicryl x2.  The subcutaneous areas were irrigated, small bleeders cauterized.  Interrupted 2-0 plain sutures placed, and the skin approximated with subcuticular 4-0 Monocryl suture with good hemostasis.  SPECIMENS:  Placenta not sent to Pathology.  Cord blood was obtained for donation.  ESTIMATED BLOOD LOSS:  800 mL.  INTRAOPERATIVE FLUID:  3400 mL.  URINE OUTPUT:  200 mL.  Sponge and instrument counts x2 was correct.  COMPLICATION:  None.  The patient tolerated the procedure well and was transferred to recovery in stable condition and baby placed for skin to skin contact.     Maxie Better, M.D.     [place]/MEDQ  D:  08/24/2013  T:  08/25/2013  Job:  [id number]

## 2013-08-26 LAB — GLUCOSE, CAPILLARY
Glucose-Capillary: 87 mg/dL (ref 70–99)
Glucose-Capillary: 76 mg/dL (ref 70–99)

## 2013-08-26 NOTE — Progress Notes (Signed)
Midwife notified of fasting blood sugar of 84 & that  Pt did not want to take any insulin.

## 2013-08-26 NOTE — Progress Notes (Signed)
Patient ID: Desiree Hopkins, female   DOB: 1980-09-24, 32 y.o.   MRN: [id number] Subjective: POD# 2 Information for the patient's newborn:  Desiree, Bickle Girl Hopkins [[id number]]  female  Reports feeling better today Feeding: breast Patient reports tolerating PO.  Breast symptoms: none Pain minimally controlled with ibuprofen (OTC) and narcotic analgesics including Percocet Denies HA/SOB/C/P/N/V/dizziness. Flatus present, no BM despite daily drinking of prune juice. Has declined Colace or Senokot. She reports vaginal bleeding as normal, without clots.  She is ambulating, urinating without difficult.     Objective:   VS:  Filed Vitals:   08/25/13 0914 08/25/13 1310 08/25/13 2115 08/26/13 0624  BP: 100/58 98/60 111/68 108/66  Pulse: 102 83 83 91  Temp: 98.4 F (36.9 C) 98.4 F (36.9 C) 98.5 F (36.9 C) 97.9 F (36.6 C)  TempSrc: Axillary Axillary Axillary Oral  Resp: 20 18 18 18   Weight:      SpO2: 96% 97% 98%     No intake or output data in the 24 hours ending 08/26/13 1636      Recent Labs  08/25/13 0553  WBC 11.1*  HGB 9.5*  HCT 28.0*  PLT 168     Blood type: --/--/O POS, O POS (12/01 1556)  Rubella:   Immune    Physical Exam:  General: alert, cooperative, no distress and morbidly obese Abdomen: soft, mildly tender, edematous, normal bowel sounds Incision: Tegaderm and Honeycomb dressing intact - skin well-approximated with sutures Uterine Fundus: firm, 1FB below umbilicus, nontender - difficult assessment d/t body habitus Lochia: minimal Ext: edema 3+ and Homans sign is negative, no sign of DVT      Assessment/Plan: 32 y.o.   POD# 2.  s/p Cesarean Delivery.  Indications: repeat                Principal Problem:   Postpartum care following cesarean delivery (12/2)  Doing well, stable.               Carb modified diet as tolerated Encourage to increase ambulation Routine post-op care Anticipate discharge tomorrow  Desiree Gower, MSN, CNM 08/26/2013, 4:00  PM

## 2013-08-26 NOTE — Progress Notes (Signed)
Patient refused to leave night light per our infant policy.  States she can not sleep like that. Pt ed. Done and emotional support given

## 2013-08-27 LAB — TYPE AND SCREEN: Unit division: 0

## 2013-08-27 MED ORDER — FERROUS SULFATE 325 (65 FE) MG PO TABS: 325.0000 mg | ORAL_TABLET | Freq: Every day | ORAL | Status: AC

## 2013-08-27 MED ORDER — SENNOSIDES-DOCUSATE SODIUM 8.6-50 MG PO TABS: 2.0000 | ORAL_TABLET | Freq: Every evening | ORAL | Status: AC | PRN

## 2013-08-27 MED ORDER — OXYCODONE-ACETAMINOPHEN 5-325 MG PO TABS: 1.0000 | ORAL_TABLET | ORAL | Status: AC | PRN

## 2013-08-27 MED ORDER — IBUPROFEN 600 MG PO TABS: 600.0000 mg | ORAL_TABLET | Freq: Four times a day (QID) | ORAL | Status: AC | PRN

## 2013-08-27 NOTE — Discharge Summary (Signed)
Obstetric Discharge Summary Reason for Admission: G3 P0 1 1 1  @ 37wks for C/S.  Current, GDM (A2) on insulin.  Polyhydramnios, hx myomectomy, previous cesarean section, morbid obesity Prenatal Procedures: NST and ultrasound Intrapartum Procedures: cesarean: low cervical, transverse, extensive lysis of adhesions Postpartum Procedures: none Complications-Operative and Postpartum: none Hemoglobin  Date Value Range Status  08/25/2013 9.5* 12.0 - 15.0 g/dL Final     DELTA CHECK NOTED     REPEATED TO VERIFY     HCT  Date Value Range Status  08/25/2013 28.0* 36.0 - 46.0 % Final    Physical Exam:  General: alert, cooperative and no distress Lochia: appropriate Uterine Fundus: firm Incision: Covered with Tegaderm and honeycomb dressing; well approximated and closed with sutures DVT Evaluation: No evidence of DVT seen on physical exam. Calf/Ankle edema is present, +2  Discharge Diagnoses: G2 P2 s/p repeat C/S d/t myomectomy, GDM (A2), polyhydramnios, with extensive LOA        ABL Anemia        Fluid retention: will send rx for HCTZ 25 mg po x 5 days  Discharge Information: Date: 08/27/2013 Activity: pelvic rest Diet: routine Medications: PNV, Ibuprofen, Colace, Iron, Percocet Condition: stable Instructions: refer to practice specific booklet Discharge to: home   Newborn Data: Live born female on 08/24/13 Jonita Albee) Birth Weight: 8 lb 12.9 oz (3995 g) APGAR: 9, 9  Home with mother.  FISHER,JULIE K 08/27/2013, 11:26 AM

## 2013-08-27 NOTE — Progress Notes (Signed)
Gave prescriptions to patient's husband.  Patient was asleep.  Instructed husband that patient is being discharged and the baby will be a patient.  Explained he will need to get these prescriptions filled so they will be available to mother for pain if needed.  Husband vocalized understanding.

## 2013-08-27 NOTE — Progress Notes (Signed)
Patient ID: Desiree Hopkins, female   DOB: [birth year], 32 y.o.   MRN: [id number] POD # 3  Subjective: Pt reports feeling sore, tired/ Pain controlled with ibuprofen and percocet Tolerating po/Voiding without problems/ No n/v/Flatus pos Activity: out of bed and ambulate, but not frequently Bleeding is moderate Newborn info:  Information for the patient's newborn:  Aquila, Menzie [[id number]]  female Feeding: breast   Objective: VS: Blood pressure 102/48, pulse 88, temperature 98.5 F (36.9 C), temperature source Oral, resp. rate 19  FBS: 87 and HS 110  LABS:  Recent Labs  08/25/13 0553  WBC 11.1*  HGB 9.5*  PLT 168                             Physical Exam:  General: alert, cooperative and no distress CV: Regular rate and rhythm Resp: clear Abdomen: soft, nontender, normal bowel sounds; mod dependent edema; pendulous, obese Incision: Covered with Tegaderm and honeycomb dressing; well approximated. Uterine Fundus: firm, below umbilicus, nontender Lochia: minimal Ext: edema +1 to +2 and Homans sign is negative, no sign of DVT    A/P: POD # 3/ G2P1102/ S/P Repeat C/Section d/t hx myomectomy, GDM (A2), polyhydramnios Doing well and stable for discharge home RX's: Ibuprofen 600mg  po Q 6 hrs prn pain #30 Refill x 1 Percocet 5/325 1 - 2 tabs po every 6 hrs prn pain  #30 No refill Niferex 150mg  po QD/BID #30/#60 Refill x 1 Colace 100mg  po up to TID prn #30 Ref x 1    Signed: Demetrius Revel, MSN, [facility] 08/27/2013, 11:05 AM

## 2013-08-31 ENCOUNTER — Ambulatory Visit (HOSPITAL_COMMUNITY): Payer: BC Managed Care – PPO

## 2013-09-06 ENCOUNTER — Inpatient Hospital Stay (HOSPITAL_COMMUNITY): Admission: AD | Admit: 2013-09-06 | Payer: Self-pay | Source: Ambulatory Visit | Admitting: Obstetrics and Gynecology

## 2014-07-25 ENCOUNTER — Encounter (HOSPITAL_COMMUNITY): Payer: Self-pay | Admitting: Obstetrics and Gynecology

## 2014-10-05 ENCOUNTER — Other Ambulatory Visit: Payer: Self-pay | Admitting: Family Medicine

## 2014-10-05 DIAGNOSIS — N644 Mastodynia: Secondary | ICD-10-CM

## 2014-10-05 DIAGNOSIS — N6012 Diffuse cystic mastopathy of left breast: Secondary | ICD-10-CM

## 2014-10-10 ENCOUNTER — Other Ambulatory Visit: Payer: BC Managed Care – PPO

## 2014-10-11 IMAGING — US US FETAL BPP W/O NONSTRESS
1 series · 8 of 8 positions shown · non-contrast
Comparison: none

[Series 1: us fetal bpp w/o nonstress · 0.38mm/px · 8 of 8 slices shown]
[im 1/8]
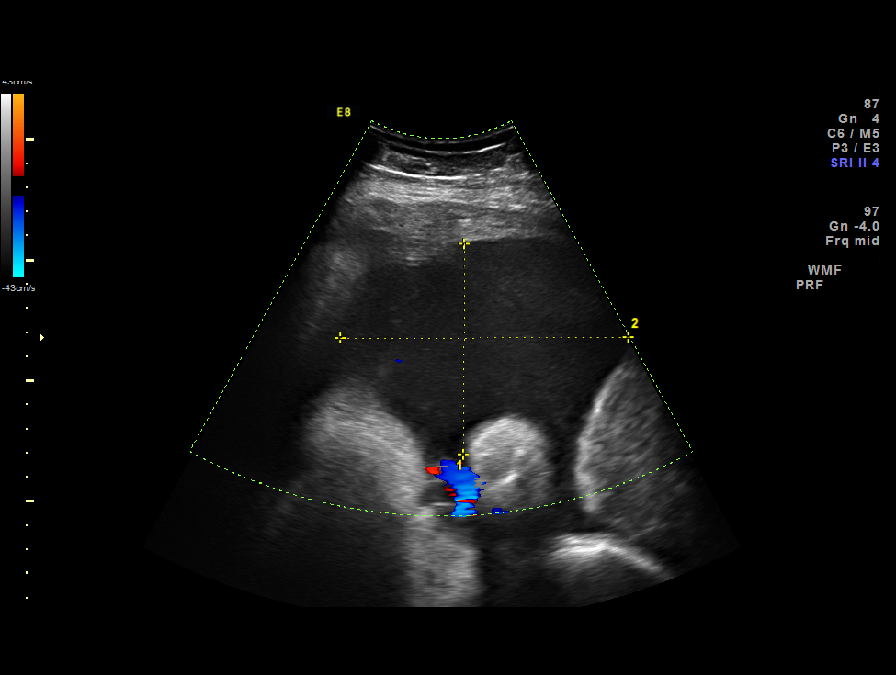
[im 2/8]
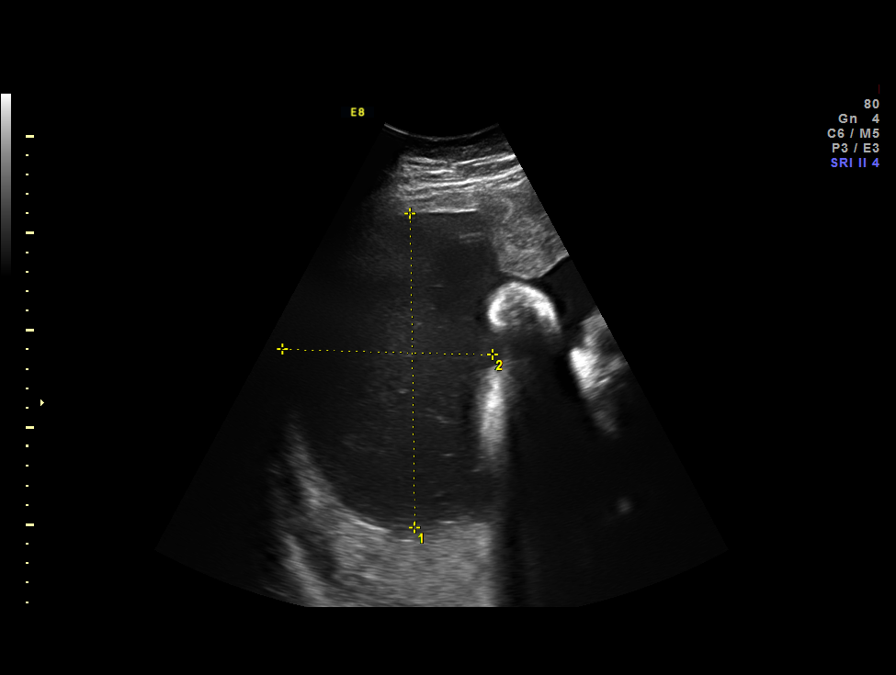
[im 3/8]
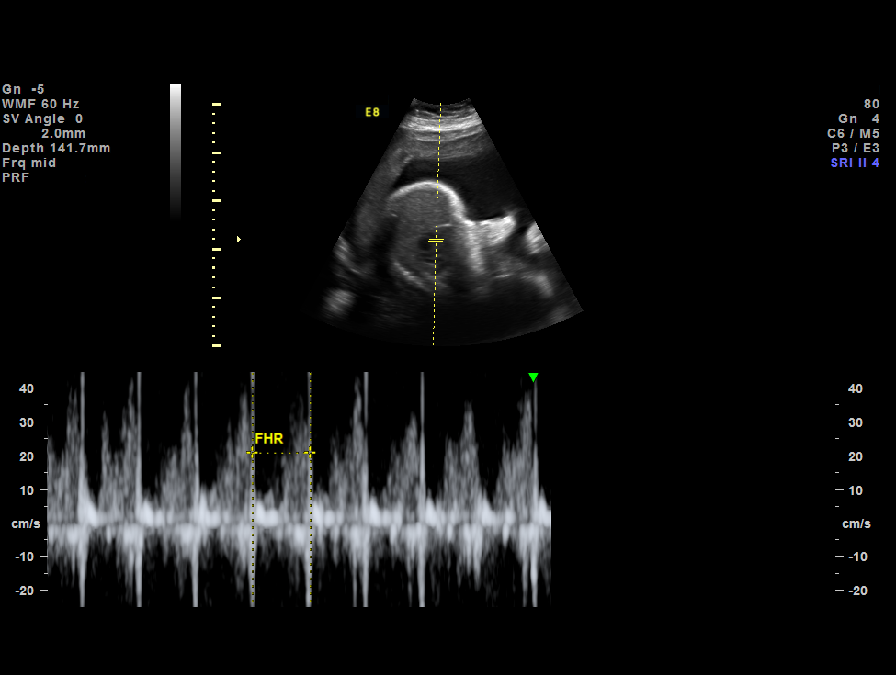
[im 4/8]
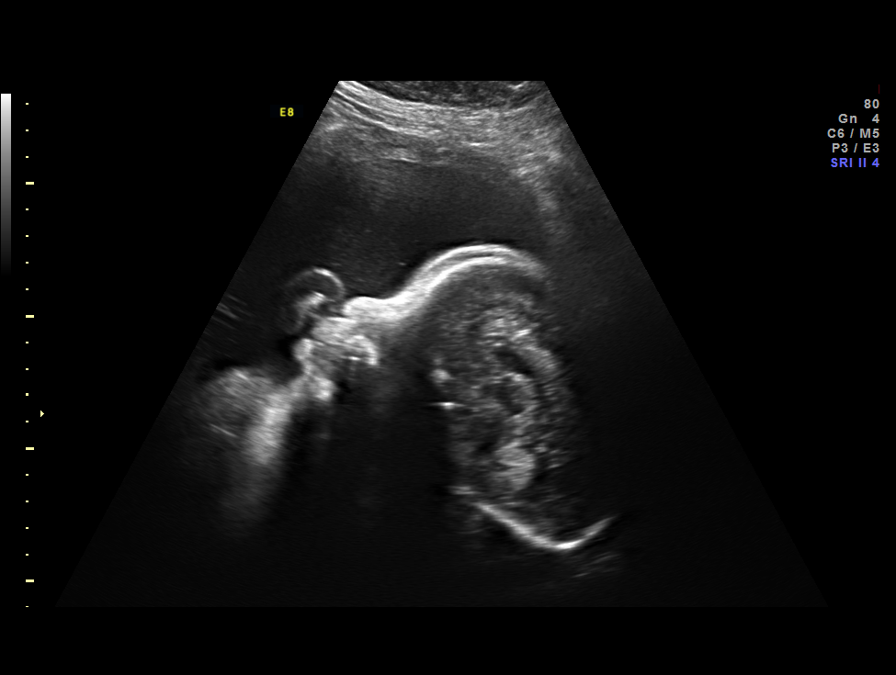
[im 5/8]
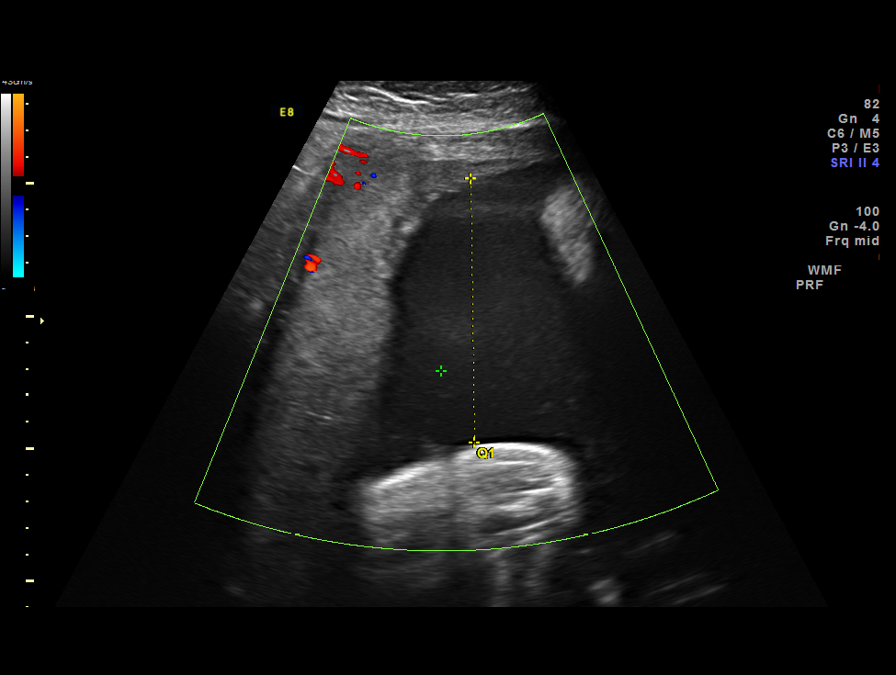
[im 6/8]
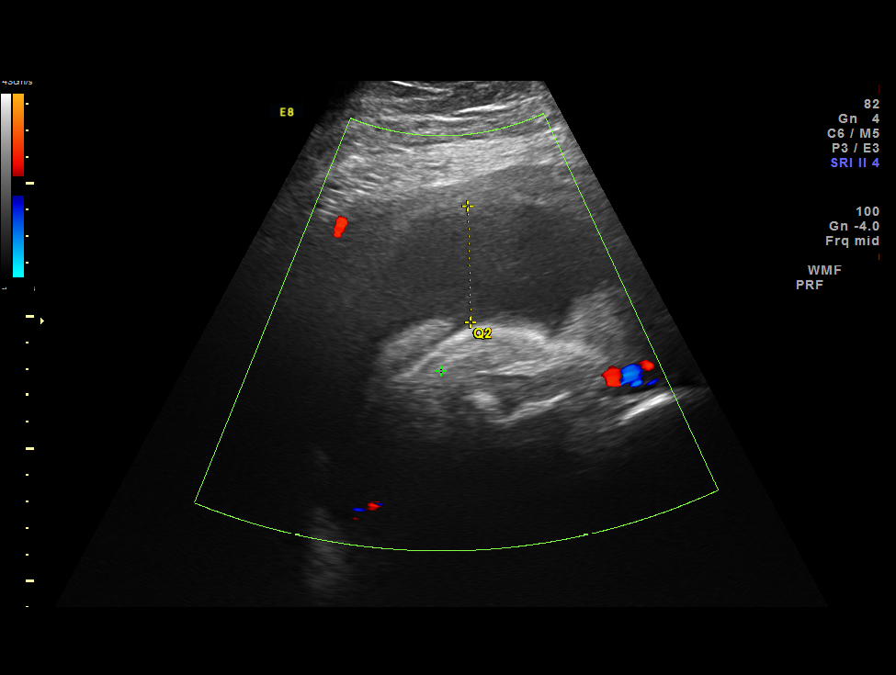
[im 7/8]
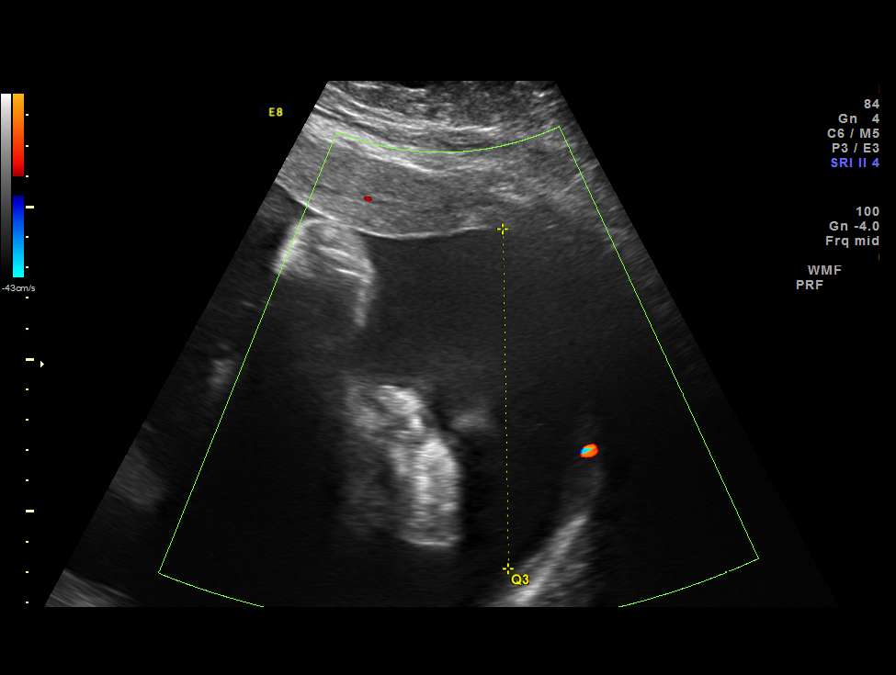
[im 8/8]
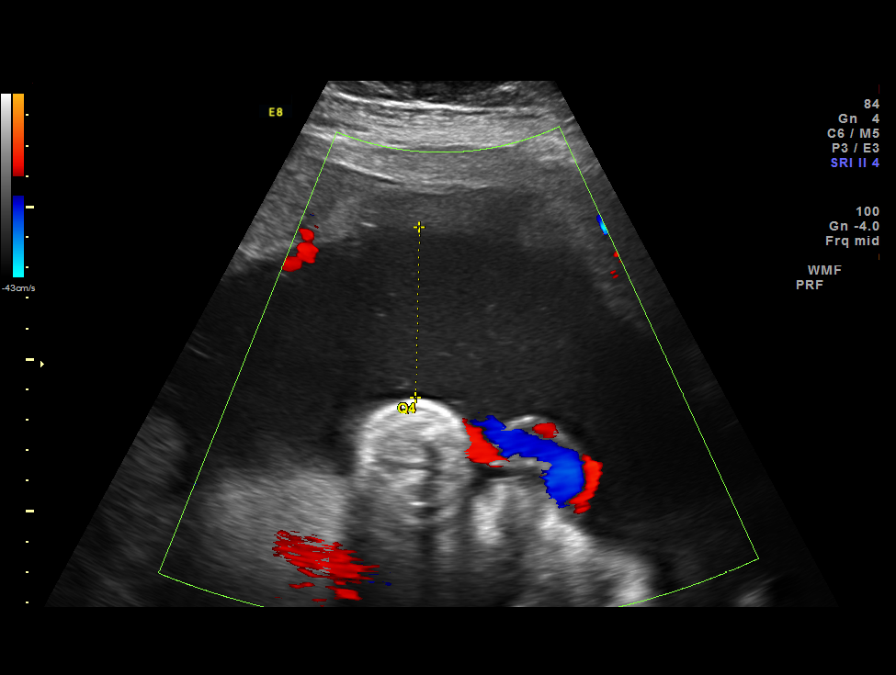

[8 of 8 positions shown; findings below may reference images not displayed]

OBSTETRICS REPORT
                      (Signed Final 08/05/2013 [DATE])

Service(s) Provided

Indications

 Non-reactive NST
 Obesity complicating pregnancy                        649.13,
 Previous cesarean section
 History of myomectomy
 Prior preterm delivery (34-35 weeks; currently
 managed with 17 OH progesterone)
 Diabetes - Gestational, A2 (medication controlled)
 glyburide
 Polyhydramnios
Fetal Evaluation

 Num Of Fetuses:    1
 Fetal Heart Rate:  143                          bpm
 Cardiac Activity:  Observed
 Presentation:      Cephalic

 Comment:    BPP [DATE] in 7 minutes.

 Amniotic Fluid
 AFI FV:      Polyhydramnios
 AFI Sum:     31.12   cm     > 97  %Tile     Larg Pckt:  11.17   cm
 RUQ:   9.96    cm   RLQ:    5.6    cm    LUQ:   4.39    cm   LLQ:    11.17  cm
Biophysical Evaluation

 Amniotic F.V:   Pocket => 2 cm two         F. Tone:        Observed
                 planes
 F. Movement:    Observed                   Score:          [DATE]
 F. Breathing:   Observed
Gestational Age

 Best:          34w 5d     Det. By:  Previous Ultrasound      EDD:   09/11/13
Impression

 Single IUP at 34 [DATE] weeks
 Poorly controlled A2 GDM, polydramnios
 BPP [DATE] (-2 for NR NST)
 AFI 31 cm.
Recommendations

 Recommend twice weekly nonstress tests with weekly
 amniotic fluid assessment.
 Patient to begin split dose insulin today - see note from
 diabetic educator
 Due to history of prior open myomectomy, recommend
 delivery via C-section at 
 37 weeks gestation.

 questions or concerns.

## 2014-10-14 ENCOUNTER — Other Ambulatory Visit: Payer: BC Managed Care – PPO

## 2014-10-17 ENCOUNTER — Other Ambulatory Visit: Payer: BC Managed Care – PPO

## 2014-10-25 ENCOUNTER — Ambulatory Visit
Admission: RE | Admit: 2014-10-25 | Discharge: 2014-10-25 | Disposition: A | Discharge status: Home / Self Care | Payer: BC Managed Care – PPO | Source: Ambulatory Visit | Attending: Family Medicine | Admitting: Family Medicine

## 2014-10-25 ENCOUNTER — Other Ambulatory Visit: Payer: Self-pay | Admitting: Family Medicine

## 2014-10-25 DIAGNOSIS — N644 Mastodynia: Secondary | ICD-10-CM

## 2014-10-25 DIAGNOSIS — N6012 Diffuse cystic mastopathy of left breast: Secondary | ICD-10-CM

## 2016-02-09 ENCOUNTER — Emergency Department (HOSPITAL_COMMUNITY)
Admission: EM | Admit: 2016-02-09 | Discharge: 2016-02-09 | Disposition: A | Discharge status: Home / Self Care | Payer: BC Managed Care – PPO | Attending: Emergency Medicine | Admitting: Emergency Medicine

## 2016-02-09 ENCOUNTER — Encounter (HOSPITAL_COMMUNITY): Payer: Self-pay | Admitting: Emergency Medicine

## 2016-02-09 DIAGNOSIS — H02401 Unspecified ptosis of right eyelid: Secondary | ICD-10-CM | POA: Diagnosis not present

## 2016-02-09 DIAGNOSIS — Z79899 Other long term (current) drug therapy: Secondary | ICD-10-CM | POA: Diagnosis not present

## 2016-02-09 DIAGNOSIS — H578 Other specified disorders of eye and adnexa: Secondary | ICD-10-CM | POA: Diagnosis present

## 2016-02-09 DIAGNOSIS — J45909 Unspecified asthma, uncomplicated: Secondary | ICD-10-CM | POA: Insufficient documentation

## 2016-02-09 DIAGNOSIS — Z794 Long term (current) use of insulin: Secondary | ICD-10-CM | POA: Insufficient documentation

## 2016-02-09 DIAGNOSIS — Z8632 Personal history of gestational diabetes: Secondary | ICD-10-CM | POA: Diagnosis not present

## 2016-02-09 HISTORY — DX: Gestational diabetes mellitus in pregnancy, unspecified control: O24.419

## 2016-02-09 LAB — CBG MONITORING, ED
GLUCOSE-CAPILLARY: 76 mg/dL (ref 65–99)
Glucose-Capillary: 85 mg/dL (ref 65–99)

## 2016-02-09 NOTE — ED Notes (Signed)
Pt sts right eye not able to open all the way intermittently today; normal at present; pt sts twitching to left side of chin as well

## 2016-02-09 NOTE — ED Notes (Signed)
Patient left at this time with all belongings. 

## 2016-02-09 NOTE — ED Provider Notes (Signed)
CSN: [id number]     Arrival date & time 02/09/16  1411 History   First MD Initiated Contact with Patient 02/09/16 1615     Chief Complaint  Patient presents with  . Eye Problem     (Consider location/radiation/quality/duration/timing/severity/associated sxs/prior Treatment) HPI Comments: Patient presents today complaining of drooping of the right eyelid and also a twitching of the left lower face.  She reports onset of symptoms while she was at work earlier today.  She reports that symptoms were present for 1.5 hours and then resolved.  She is asymptomatic at this time.  She denies any injury or trauma to the face or eye.  She denies any numbness, tingling, headache, weakness, fever, chills, or any other symptoms.   She denies any recent viral illnesses.    Patient is a 35 y.o. female presenting with eye problem. The history is provided by the patient.  Eye Problem   Past Medical History  Diagnosis Date  . Polyhydramnios 08/2013    with this pregnancy  . Asthma     rarely uses inhaler  . Heartburn in pregnancy   . Headache(784.0)     otc med prn  . Carpal tunnel syndrome     bilaterally right and left - no surgery   . Postpartum care following cesarean delivery (12/2) 08/24/2013  . Gestational diabetes    Past Surgical History  Procedure Laterality Date  . Cesarean section  11/2009    [place], [place]  . Myomectomy  08/2010    [place], [place]  . Neck surgery      lymph nodes removed as child for cat scratch fever  . Cesarean section N/A 08/24/2013    Procedure: Repeat CESAREAN SECTION;  Surgeon: Marvene Staff, MD;  Location: [facility];  Service: Obstetrics;  Laterality: N/A;  EDD: 09/11/13   History reviewed. No pertinent family history. Social History  Substance Use Topics  . Smoking status: Never Smoker   . Smokeless tobacco: Never Used  . Alcohol Use: No   OB History    Gravida Para Term Preterm AB TAB SAB Ectopic Multiple Living   2 2 1 1      2      Review of  Systems  All other systems reviewed and are negative.     Allergies  Hydroxyprogesterone caproate  Home Medications   Prior to Admission medications   Medication Sig Start Date End Date Taking? Authorizing Provider  albuterol (PROVENTIL HFA;VENTOLIN HFA) 108 (90 BASE) MCG/ACT inhaler Inhale 2 puffs into the lungs every 6 (six) hours as needed for shortness of breath.    Historical Provider, MD  calcium carbonate (TUMS - DOSED IN MG ELEMENTAL CALCIUM) 500 MG chewable tablet Chew 1-2 tablets by mouth daily as needed for heartburn.    Historical Provider, MD  ferrous sulfate 325 (65 FE) MG tablet Take 1 tablet (325 mg total) by mouth daily with breakfast. 08/27/13   Gustavo Lah, NP  ibuprofen (ADVIL,MOTRIN) 600 MG tablet Take 1 tablet (600 mg total) by mouth every 6 (six) hours as needed. 08/27/13   Gustavo Lah, NP  insulin aspart (NOVOLOG) 100 UNIT/ML injection Inject 8 Units into the skin 3 (three) times daily before meals.    Historical Provider, MD  insulin NPH (HUMULIN N,NOVOLIN N) 100 UNIT/ML injection Inject 38 Units into the skin daily before breakfast. 38 units in the morning and 16 units at bedtime    Historical Provider, MD  insulin NPH (HUMULIN N,NOVOLIN N) 100 UNIT/ML injection  Inject 14 Units into the skin at bedtime. Takes 38 units in the morning and 14 units at bedtime    Historical Provider, MD  oxyCODONE-acetaminophen (PERCOCET/ROXICET) 5-325 MG per tablet Take 1-2 tablets by mouth every 4 (four) hours as needed for severe pain (moderate - severe pain). 08/27/13   Gustavo Lah, NP  Prenatal Vit-Fe Fumarate-FA (MULTIVITAMIN-PRENATAL) 27-0.8 MG TABS tablet Take 1 tablet by mouth daily at 12 noon.    Historical Provider, MD  senna-docusate (SENOKOT-S) 8.6-50 MG per tablet Take 2 tablets by mouth at bedtime as needed for mild constipation. 08/27/13   Gustavo Lah, NP   BP 114/80 mmHg  Pulse 72  Temp(Src) 98.6 F (37 C) (Oral)  Resp 14  SpO2 100% Physical Exam  Constitutional:  She appears well-developed and well-nourished.  HENT:  Head: Normocephalic and atraumatic.  Mouth/Throat: Oropharynx is clear and moist.  Eyes: EOM are normal. Pupils are equal, round, and reactive to light.  No ptosis  Neck: Normal range of motion. Neck supple.  Cardiovascular: Normal rate, regular rhythm and normal heart sounds.   Pulmonary/Chest: Effort normal and breath sounds normal.  Neurological: She is alert. She has normal strength. No cranial nerve deficit or sensory deficit. Coordination and gait normal.  Skin: Skin is warm and dry.  Psychiatric: She has a normal mood and affect.  Nursing note and vitals reviewed.   ED Course  Procedures (including critical care time) Labs Review Labs Reviewed  CBG MONITORING, ED  CBG MONITORING, ED    Imaging Review No results found. I have personally reviewed and evaluated these images and lab results as part of my medical decision-making.   EKG Interpretation None      MDM   Final diagnoses:  None   Patient presents today with complaints of drooping of her right upper eyelid and also twitching of the left lower face.  She reports that symptoms lasted approximately 1.5 hours and then resolved.  She is asymptomatic upon arrival in the ED.  She has a normal neurological exam.  Due to the fact that the symptoms occurred on both the right side of the face and the left side of the face and only lasted 1.5 hours, feel that Bell's Palsy is unlikely.  Due to the fact that symptoms have completely resolved.  Do not feel that imaging is indicated.  Patient instructed to follow up with PCP.  Patient also evaluated by Dr. Lacinda Axon who is in agreement with the plan.  Patient stable for discharge.  Return precautions given.    Hyman Bible, PA-C 02/11/16 [place], MD 02/11/16 [phone]

## 2016-02-09 NOTE — ED Notes (Signed)
PA at bedside.
